# Patient Record
Sex: Male | Born: 2000 | Race: White | Hispanic: No | Marital: Single | Smoking: Never smoker
Health system: Southern US, Community
[De-identification: ages and names within clinical notes are randomized; demographics above are authoritative.]

## PROBLEM LIST (undated history)

## (undated) DIAGNOSIS — F32A Depression, unspecified: Secondary | ICD-10-CM

## (undated) DIAGNOSIS — F329 Major depressive disorder, single episode, unspecified: Secondary | ICD-10-CM

---
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
## 2019-02-10 ENCOUNTER — Other Ambulatory Visit: Payer: Self-pay

## 2019-02-10 DIAGNOSIS — Z20822 Contact with and (suspected) exposure to covid-19: Secondary | ICD-10-CM

## 2019-02-12 LAB — NOVEL CORONAVIRUS, NAA: SARS-CoV-2, NAA: NOT DETECTED

## 2019-04-24 ENCOUNTER — Ambulatory Visit: Payer: Medicaid Other | Attending: Internal Medicine

## 2019-04-24 DIAGNOSIS — Z20822 Contact with and (suspected) exposure to covid-19: Secondary | ICD-10-CM

## 2019-04-26 LAB — NOVEL CORONAVIRUS, NAA: SARS-CoV-2, NAA: NOT DETECTED

## 2020-12-03 ENCOUNTER — Emergency Department (HOSPITAL_COMMUNITY)
Admission: EM | Admit: 2020-12-03 | Discharge: 2020-12-03 | Disposition: A | Discharge status: Home / Self Care | Payer: Medicaid Other | Attending: Emergency Medicine | Admitting: Emergency Medicine

## 2020-12-03 ENCOUNTER — Encounter (HOSPITAL_COMMUNITY): Payer: Self-pay

## 2020-12-03 ENCOUNTER — Other Ambulatory Visit: Payer: Self-pay

## 2020-12-03 DIAGNOSIS — K0889 Other specified disorders of teeth and supporting structures: Secondary | ICD-10-CM | POA: Insufficient documentation

## 2020-12-03 MED ORDER — NAPROXEN 500 MG PO TABS: 500.0000 mg | ORAL_TABLET | Freq: Two times a day (BID) | ORAL | 0 refills | Status: AC

## 2020-12-03 MED ORDER — AMOXICILLIN-POT CLAVULANATE 875-125 MG PO TABS: 1.0000 | ORAL_TABLET | Freq: Two times a day (BID) | ORAL | 0 refills | Status: DC

## 2020-12-03 NOTE — ED Triage Notes (Signed)
Pt reports chipped lower right molar with severe pain.

## 2020-12-03 NOTE — Discharge Instructions (Signed)
Take antibiotics as prescribed. Take naproxen as needed for pain. Follow-up with the dentist listed below and use the dental resource guide for further resources. Return to the ER for worsening pain, trouble breathing, trouble swallowing, neck stiffness.

## 2020-12-03 NOTE — ED Provider Notes (Signed)
[facility] EMERGENCY DEPARTMENT Provider Note   CSN: [id number] Arrival date & time: 12/03/20  1036     History Chief Complaint  Patient presents with   Dental Pain    Dominic Chase is a 20 y.o. male presenting to the ED with a chief complaint of dental pain.  Reports right lower dental pain from a chipped tooth.  She has been tooth for "a while now" but states that pain has gradually worsened.  Minimal improvement noted with ibuprofen.  Has not seen a dentist in several years but is trying to get in touch with 1 unfortunately unable to do so due to lack of insurance.  He denies any drainage from the area, facial swelling, injury or trauma, neck stiffness, trismus or drooling.   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 No pertinent surgical history.     Family History  Problem Relation Age of Onset   Heart disease Father        HOCM    Social History   Tobacco Use   Smoking status: Never   Smokeless tobacco: Never  Substance Use Topics   Alcohol use: No   Drug use: No    Home Medications Prior to Admission medications   Medication Sig Start Date End Date Taking? Authorizing Provider  amoxicillin-clavulanate (AUGMENTIN) 875-125 MG tablet Take 1 tablet by mouth every 12 (twelve) hours. 12/03/20  Yes Hadriel Northup, PA-C  naproxen (NAPROSYN) 500 MG tablet Take 1 tablet (500 mg total) by mouth 2 (two) times daily. 12/03/20  Yes Lakeita Panther, PA-C    Allergies    Peanuts [peanut oil]  Review of Systems   Review of Systems  Constitutional:  Negative for chills and fever.  HENT:  Positive for dental problem. Negative for voice  change.   Respiratory:  Negative for shortness of breath.    Physical Exam Updated Vital Signs BP (!) 145/86 (BP Location: Right Arm)   Pulse 74   Temp 98.7 F (37.1 C) (Oral)   Resp 16   SpO2 100%   Physical Exam Vitals and nursing note reviewed.  Constitutional:      General: He is not in acute distress.    Appearance: He is well-developed. He is not diaphoretic.  HENT:     Head: Normocephalic and atraumatic.     Mouth/Throat:     Dentition: Abnormal dentition. Dental tenderness present. No dental caries or dental abscesses.      Comments: Chipped tooth in the indicated area.  No gross dental abscess or site of drainage. No facial, neck or cheek swelling noted. No pooling of secretions or trismus.  Normal voice noted with no difficulty swallowing or breathing.  No submandibular erythema, edema or crepitus noted.   Eyes:     General: No scleral icterus.    Conjunctiva/sclera: Conjunctivae normal.  Pulmonary:     Effort: Pulmonary effort is normal. No respiratory distress.  Musculoskeletal:     Cervical back: Normal range of motion.  Skin:    Findings: No rash.  Neurological:     Mental Status: He is alert.    ED Results / Procedures /  Treatments   Labs (all labs ordered are listed, but only abnormal results are displayed) Labs Reviewed - No data to display  EKG None  Radiology No results found.  Procedures Procedures   Medications Ordered in ED Medications - No data to display  ED Course  I have reviewed the triage vital signs and the nursing notes.  Pertinent labs & imaging results that were available during my care of the patient were reviewed by me and considered in my medical decision making (see chart for details).    MDM Rules/Calculators/A&P                           Patient with dentalgia. On exam, there is no evidence of a drainable abscess. No trismus, glossal elevation, unilateral tonsillar swelling. No evidence of retropharyngeal or  peritonsillar abscess or Ludwig angina. Will treat with naproxen. Pt instructed to follow-up with dentist as soon as possible. Resource guide provided with AVS.   Patient is hemodynamically stable, in NAD, and able to ambulate in the ED. Evaluation does not show pathology that would require ongoing emergent intervention or inpatient treatment. I explained the diagnosis to the patient. Pain has been managed and has no complaints prior to discharge. Patient is comfortable with above plan and is stable for discharge at this time. All questions were answered prior to disposition. Strict return precautions for returning to the ED were discussed. Encouraged follow up with PCP.   An After Visit Summary was printed and given to the patient.   Portions of this note were generated with Scientist, clinical (histocompatibility and immunogenetics). Dictation errors may occur despite best attempts at proofreading.  Final Clinical Impression(s) / ED Diagnoses Final diagnoses:  Pain, dental    Rx / DC Orders ED Discharge Orders          Ordered    amoxicillin-clavulanate (AUGMENTIN) 875-125 MG tablet  Every 12 hours        12/03/20 1130    naproxen (NAPROSYN) 500 MG tablet  2 times daily        12/03/20 1130             Dietrich Pates, PA-C 12/03/20 1133    Wynetta Fines, MD 12/04/20 1750

## 2023-01-24 ENCOUNTER — Emergency Department (HOSPITAL_COMMUNITY)
Admission: EM | Admit: 2023-01-24 | Discharge: 2023-01-25 | Disposition: A | Discharge status: Home / Self Care | Payer: Self-pay | Attending: Emergency Medicine | Admitting: Emergency Medicine

## 2023-01-24 DIAGNOSIS — E8809 Other disorders of plasma-protein metabolism, not elsewhere classified: Secondary | ICD-10-CM | POA: Insufficient documentation

## 2023-01-24 DIAGNOSIS — R7309 Other abnormal glucose: Secondary | ICD-10-CM | POA: Insufficient documentation

## 2023-01-24 DIAGNOSIS — R45851 Suicidal ideations: Secondary | ICD-10-CM | POA: Insufficient documentation

## 2023-01-24 DIAGNOSIS — F410 Panic disorder [episodic paroxysmal anxiety] without agoraphobia: Secondary | ICD-10-CM | POA: Insufficient documentation

## 2023-01-24 DIAGNOSIS — D72829 Elevated white blood cell count, unspecified: Secondary | ICD-10-CM | POA: Insufficient documentation

## 2023-01-24 DIAGNOSIS — T1491XA Suicide attempt, initial encounter: Secondary | ICD-10-CM

## 2023-01-24 DIAGNOSIS — Z91010 Allergy to peanuts: Secondary | ICD-10-CM | POA: Insufficient documentation

## 2023-01-24 LAB — CBC
HCT: 46.3 % (ref 39.0–52.0)
Hemoglobin: 16.3 g/dL (ref 13.0–17.0)
MCH: 31.3 pg (ref 26.0–34.0)
MCHC: 35.2 g/dL (ref 30.0–36.0)
MCV: 88.9 fL (ref 80.0–100.0)
Platelets: 227 10*3/uL (ref 150–400)
RBC: 5.21 MIL/uL (ref 4.22–5.81)
RDW: 11.7 % (ref 11.5–15.5)
WBC: 10.6 10*3/uL — ABNORMAL HIGH (ref 4.0–10.5)
nRBC: 0 % (ref 0.0–0.2)

## 2023-01-24 LAB — COMPREHENSIVE METABOLIC PANEL
ALT: 18 U/L (ref 0–44)
AST: 20 U/L (ref 15–41)
Albumin: 5.3 g/dL — ABNORMAL HIGH (ref 3.5–5.0)
Alkaline Phosphatase: 63 U/L (ref 38–126)
Anion gap: 10 (ref 5–15)
BUN: 13 mg/dL (ref 6–20)
CO2: 27 mmol/L (ref 22–32)
Calcium: 9.9 mg/dL (ref 8.9–10.3)
Chloride: 101 mmol/L (ref 98–111)
Creatinine, Ser: 0.75 mg/dL (ref 0.61–1.24)
GFR, Estimated: >60 mL/min (ref >60)
Glucose, Bld: 112 mg/dL — ABNORMAL HIGH (ref 70–99)
Potassium: 4 mmol/L (ref 3.5–5.1)
Sodium: 138 mmol/L (ref 135–145)
Total Bilirubin: 2 mg/dL — ABNORMAL HIGH (ref <1.2)
Total Protein: 8.2 g/dL — ABNORMAL HIGH (ref 6.5–8.1)

## 2023-01-24 LAB — RAPID URINE DRUG SCREEN, HOSP PERFORMED
Amphetamines: NOT DETECTED
Barbiturates: NOT DETECTED
Benzodiazepines: NOT DETECTED
Cocaine: NOT DETECTED
Opiates: NOT DETECTED
Tetrahydrocannabinol: POSITIVE — AB

## 2023-01-24 LAB — ACETAMINOPHEN LEVEL: Acetaminophen (Tylenol), Serum: <10 ug/mL — ABNORMAL LOW (ref 10–30)

## 2023-01-24 LAB — SALICYLATE LEVEL: Salicylate Lvl: <7 mg/dL — ABNORMAL LOW (ref 7.0–30.0)

## 2023-01-24 LAB — ETHANOL: Alcohol, Ethyl (B): <10 mg/dL (ref <10)

## 2023-01-24 MED ORDER — LORAZEPAM 0.5 MG PO TABS
0.5000 mg | ORAL_TABLET | Freq: Once | ORAL | Status: AC
  Administered 2023-01-24: 0.5 mg via ORAL
  Filled 2023-01-24: qty 1

## 2023-01-24 NOTE — ED Notes (Signed)
Pt is denying SI and asking about leaving at this time.

## 2023-01-24 NOTE — BH Assessment (Signed)
TTS consult has been deferred to [facility]. [facility] Coordinator will communicate in established secure chat with time of assessment and name of provider.
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
## 2023-01-24 NOTE — ED Notes (Signed)
Pt given ginger ale as well as [place] ice push pop.   Significant other to the stretcher. Pt is calm and cooperative
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
## 2023-01-25 MED ORDER — ESCITALOPRAM OXALATE 10 MG PO TABS: 10.0000 mg | ORAL_TABLET | Freq: Every day | ORAL | 2 refills | Status: DC

## 2023-01-25 MED ORDER — ESCITALOPRAM OXALATE 10 MG PO TABS: 10.0000 mg | ORAL_TABLET | Freq: Every day | ORAL | 2 refills | Status: AC

## 2023-01-25 NOTE — Progress Notes (Signed)
[facility] Consult Note  Patient Name: Dominic Chase MRN: [id number] DOB: 2001/03/13 DATE OF Consult: 01/25/2023  PRIMARY PSYCHIATRIC DIAGNOSES  1.  Panic attack  2.  Adjustment disorder with anxious and depressed mood    RECOMMENDATIONS  Recommendations: Medication recommendations: Start Escitalopram 5 mg for 4 days then increase to 10mg  daiy  Non-Medication/therapeutic recommendations: Please provide patient with outpatient resources  Is inpatient psychiatric hospitalization recommended for this patient? No (Explain why): patient denies SI or HI, no indication  Follow-Up Telepsychiatry C/L services: We will sign off for now. Please re-consult our service if needed for any concerning changes in the patient's condition, discharge planning, or questions.  Thank you for involving [place] in the care of this patient. If you have any additional questions or concerns, please call [phone] and ask for me or the provider on-call.  TELEPSYCHIATRY ATTESTATION & CONSENT  As the provider for this telehealth consult, I attest that I verified the patient's identity using two separate identifiers, introduced myself to the patient, provided my credentials, disclosed my location, and performed this encounter via a HIPAA-compliant, real-time, face-to-face, two-way, interactive audio and video platform and with the full consent and agreement of the patient (or guardian as applicable.)  Patient physical location: [facility] Emergency Department at [facility] . Telehealth provider physical location: home office in state of [place].  Video start time: 3:20 am (Central Time) Video end time: 3:45 am (Central Time)  IDENTIFYING DATA  JAGJIT RINER is a 22 y.o. year-old male for whom a psychiatric consultation has been ordered by the primary provider. The patient was identified using two separate identifiers.  CHIEF COMPLAINT/REASON FOR CONSULT  Panic attack   HISTORY OF PRESENT ILLNESS (HPI)   The patient He is a 22 year old male, who presents to the emergency department, complaining of chest pain due to a panic attack.  He tells me that he has been in a relationship of 4 years and they have breaking up.  She is sleeping this morning.  He is brokenhearted, upset, and was panicking.  She reports feeling that her needs are no longer being met in the relationship so she is taking off.  They currently live in his mom's property.  He feels that his mom is supportive, he also thinks that he has good and supportive friends that he can rely on.  He adamantly denies suicidal or homicidal ideation.  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  He was previously on Lexapro but had to stop taking it because of insurance.  He would like to restart it.  He does not have a physician to follow up with and would appreciate a referral for that.  He feels safe discharging home.Marland Kitchen  PAST PSYCHIATRIC HISTORY   Otherwise as per HPI above.  PAST MEDICAL HISTORY  Past Medical History:  Diagnosis Date   Depression      HOME MEDICATIONS  Facility Ordered Medications  Medication   [COMPLETED] LORazepam (ATIVAN) tablet 0.5 mg   PTA Medications  Medication Sig   escitalopram (LEXAPRO) 10 MG tablet Take 1 tablet (10 mg total) by mouth daily. Take 5 mg for 4 days then increase to 10 mg daily   amoxicillin-clavulanate (AUGMENTIN) 875-125 MG tablet Take 1 tablet by mouth every 12 (twelve) hours.   naproxen (NAPROSYN) 500 MG tablet Take 1 tablet (500 mg total) by mouth 2 (two) times daily.     ALLERGIES  Allergies  Allergen Reactions   Peanuts [Peanut Oil]  Anaphylaxis    Pt has an epi pen    SOCIAL & SUBSTANCE USE HISTORY  Social History   Socioeconomic History   Marital status: Single    Spouse name: Not on file   Number of children: Not on file   Years of education: Not on file   Highest education level: Not on file  Occupational History   Not on file  Tobacco Use   Smoking status: Never    Smokeless tobacco: Never  Substance and Sexual Activity   Alcohol use: No   Drug use: No   Sexual activity: Yes  Other Topics Concern   Not on file  Social History Narrative   Not on file   Social Determinants of Health   Financial Resource Strain: Not on file  Food Insecurity: Not on file  Transportation Needs: Not on file  Physical Activity: Not on file  Stress: Not on file  Social Connections: Not on file   Social History   Tobacco Use  Smoking Status Never  Smokeless Tobacco Never   Social History   Substance and Sexual Activity  Alcohol Use No   Social History   Substance and Sexual Activity  Drug Use No      FAMILY HISTORY  Family History  Problem Relation Age of Onset   Heart disease Father        HOCM    MENTAL STATUS EXAM (MSE)  Presentation  General Appearance:  Appropriate for Environment  Eye Contact: Good  Speech: Clear and Coherent  Speech Volume: Normal  Handedness:No data recorded  Mood and Affect  Mood: Depressed  Affect: Appropriate   Thought Process  Thought Processes: Coherent  Descriptions of Associations: Intact  Orientation: Full (Time, Place and Person)  Thought Content: Logical  History of Schizophrenia/Schizoaffective disorder:No data recorded Duration of Psychotic Symptoms:No data recorded Hallucinations: Hallucinations: None  Ideas of Reference: None  Suicidal Thoughts: Suicidal Thoughts: No  Homicidal Thoughts: Homicidal Thoughts: No   Sensorium  Memory: Immediate Good; Recent Good  Judgment: Fair  Insight: Fair   Art therapist  Concentration: Fair  Attention Span: Good  Recall: Good  Fund of Knowledge: Good  Language: Good   Psychomotor Activity  Psychomotor Activity: Psychomotor Activity: Normal   Assets  Assets: Communication Skills; Desire for Improvement   Sleep  Sleep: Sleep: Fair   VITALS  Blood pressure (!) 129/91, pulse 95, temperature  98.3 F (36.8 C), temperature source Oral, resp. rate 16, height 6\' 2"  (1.88 m), weight 59 kg, SpO2 99%.  LABS  Admission on 01/24/2023  Component Date Value Ref Range Status   Sodium 01/24/2023 138  135 - 145 mmol/L Final   Potassium 01/24/2023 4.0  3.5 - 5.1 mmol/L Final   Chloride 01/24/2023 101  98 - 111 mmol/L Final   CO2 01/24/2023 27  22 - 32 mmol/L Final   Glucose, Bld 01/24/2023 112 (H)  70 - 99 mg/dL Final   Glucose reference range applies only to samples taken after fasting for at least 8 hours.   BUN 01/24/2023 13  6 - 20 mg/dL Final   Creatinine, Ser 01/24/2023 0.75  0.61 - 1.24 mg/dL Final   Calcium 16/12/9602 9.9  8.9 - 10.3 mg/dL Final   Total Protein 54/11/8117 8.2 (H)  6.5 - 8.1 g/dL Final   Albumin 14/78/2956 5.3 (H)  3.5 - 5.0 g/dL Final   AST 21/30/8657 20  15 - 41 U/L Final   ALT 01/24/2023 18  0 - 44 U/L  Final   Alkaline Phosphatase 01/24/2023 63  38 - 126 U/L Final   Total Bilirubin 01/24/2023 2.0 (H)  <1.2 mg/dL Final   GFR, Estimated 01/24/2023 >60  >60 mL/min Final   Comment: (NOTE) Calculated using the CKD-EPI Creatinine Equation (2021)    Anion gap 01/24/2023 10  5 - 15 Final   Performed at [facility], [address], [place], [place] [address]   Alcohol, Ethyl (B) 01/24/2023 <10  <10 mg/dL Final   Comment: (NOTE) Lowest detectable limit for serum alcohol is 10 mg/dL.  For medical purposes only. Performed at [facility], [address], [place], [place] [address]    Salicylate Lvl 01/24/2023 <7.0 (L)  7.0 - 30.0 mg/dL Final   Performed at [facility], [address], [place], [place] [address]   Acetaminophen (Tylenol), Serum 01/24/2023 <10 (L)  10 - 30 ug/mL Final   Comment: (NOTE) Therapeutic concentrations vary significantly. A range of 10-30 ug/mL  may be an effective concentration for many patients. However, some  are best treated at concentrations outside of this  range. Acetaminophen concentrations >150 ug/mL at 4 hours after ingestion  and >50 ug/mL at 12 hours after ingestion are often associated with  toxic reactions.  Performed at [facility], [address] [place], [place] [address]    WBC 01/24/2023 10.6 (H)  4.0 - 10.5 K/uL Final   RBC 01/24/2023 5.21  4.22 - 5.81 MIL/uL Final   Hemoglobin 01/24/2023 16.3  13.0 - 17.0 g/dL Final   HCT 03/47/4259 46.3  39.0 - 52.0 % Final   MCV 01/24/2023 88.9  80.0 - 100.0 fL Final   MCH 01/24/2023 31.3  26.0 - 34.0 pg Final   MCHC 01/24/2023 35.2  30.0 - 36.0 g/dL Final   RDW 56/38/7564 11.7  11.5 - 15.5 % Final   Platelets 01/24/2023 227  150 - 400 K/uL Final   nRBC 01/24/2023 0.0  0.0 - 0.2 % Final   Performed at [facility], [address] [place], [place] [address]   Opiates 01/24/2023 NONE DETECTED  NONE DETECTED Final   Cocaine 01/24/2023 NONE DETECTED  NONE DETECTED Final   Benzodiazepines 01/24/2023 NONE DETECTED  NONE DETECTED Final   Amphetamines 01/24/2023 NONE DETECTED  NONE DETECTED Final   Tetrahydrocannabinol 01/24/2023 POSITIVE (A)  NONE DETECTED Final   Barbiturates 01/24/2023 NONE DETECTED  NONE DETECTED Final   Comment: (NOTE) DRUG SCREEN FOR MEDICAL PURPOSES ONLY.  IF CONFIRMATION IS NEEDED FOR ANY PURPOSE, NOTIFY LAB WITHIN 5 DAYS.  LOWEST DETECTABLE LIMITS FOR URINE DRUG SCREEN Drug Class                     Cutoff (ng/mL) Amphetamine and metabolites    1000 Barbiturate and metabolites    200 Benzodiazepine                 200 Opiates and metabolites        300 Cocaine and metabolites        300 THC                            50 Performed at [facility], [address], [place], [place] [address]     PSYCHIATRIC REVIEW OF SYSTEMS (ROS)  ROS: Notable for the following relevant positive findings: ROS  Additional findings:      Musculoskeletal: No abnormal movements  observed      Gait & Station: Normal       Pain Screening: Denies      RISK FORMULATION/ASSESSMENT  Is the patient experiencing any suicidal or homicidal ideations: No        Protective factors considered for safety management: future oriented, help seeking   Risk factors/concerns considered for safety management:  Depression Male gender  Is there a safety management plan with the patient and treatment team to minimize risk factors and promote protective factors: Yes           Explain: medication and outpatient referral  Is crisis care placement or psychiatric hospitalization recommended: No     Based on my current evaluation and risk assessment, patient is determined at this time to be at:  Low risk  *RISK ASSESSMENT Risk assessment is a dynamic process; it is possible that this patient's condition, and risk level, may change. This should be re-evaluated and managed over time as appropriate. Please re-consult psychiatric consult services if additional assistance is needed in terms of risk assessment and management. If your team decides to discharge this patient, please advise the patient how to best access emergency psychiatric services, or to call 911, if their condition worsens or they feel unsafe in any way.   Dian Situ, MD Telepsychiatry Consult ServicesPatient ID: Ruthann Cancer, male   DOB: Nov 30, 2000, 22 y.o.   MRN: [id number]

## 2023-02-21 ENCOUNTER — Other Ambulatory Visit: Payer: Self-pay

## 2023-02-21 ENCOUNTER — Emergency Department (HOSPITAL_COMMUNITY)
Admission: EM | Admit: 2023-02-21 | Discharge: 2023-02-21 | Disposition: A | Discharge status: Home / Self Care | Payer: Self-pay | Attending: Emergency Medicine | Admitting: Emergency Medicine

## 2023-02-21 DIAGNOSIS — K0889 Other specified disorders of teeth and supporting structures: Secondary | ICD-10-CM

## 2023-02-21 DIAGNOSIS — K029 Dental caries, unspecified: Secondary | ICD-10-CM | POA: Insufficient documentation

## 2023-02-21 DIAGNOSIS — Z91010 Allergy to peanuts: Secondary | ICD-10-CM | POA: Insufficient documentation

## 2023-02-21 MED ORDER — AMOXICILLIN-POT CLAVULANATE 875-125 MG PO TABS
1.0000 | ORAL_TABLET | Freq: Once | ORAL | Status: AC
  Administered 2023-02-21: 1 via ORAL
  Filled 2023-02-21: qty 1

## 2023-02-21 MED ORDER — LIDOCAINE VISCOUS HCL 2 % MT SOLN
15.0000 mL | Freq: Once | OROMUCOSAL | Status: AC
  Administered 2023-02-21: 15 mL via OROMUCOSAL
  Filled 2023-02-21: qty 15

## 2023-02-21 MED ORDER — IBUPROFEN 800 MG PO TABS
800.0000 mg | ORAL_TABLET | Freq: Once | ORAL | Status: AC
  Administered 2023-02-21: 800 mg via ORAL
  Filled 2023-02-21: qty 1

## 2023-02-21 MED ORDER — IBUPROFEN 600 MG PO TABS: 600.0000 mg | ORAL_TABLET | Freq: Four times a day (QID) | ORAL | 0 refills | Status: AC | PRN

## 2023-02-21 MED ORDER — OXYCODONE-ACETAMINOPHEN 5-325 MG PO TABS
1.0000 | ORAL_TABLET | Freq: Once | ORAL | Status: AC
  Administered 2023-02-21: 1 via ORAL
  Filled 2023-02-21: qty 1

## 2023-02-21 MED ORDER — AMOXICILLIN-POT CLAVULANATE 875-125 MG PO TABS
1.0000 | ORAL_TABLET | Freq: Two times a day (BID) | ORAL | 0 refills | Status: AC
Start: 2023-02-21 — End: ?

## 2023-02-21 NOTE — ED Triage Notes (Signed)
Pt. Stated, Dominic Chase had a cracked back lower right tooth  for a year and the last 4 days its become infected and so painful Im unable to sleep

## 2023-02-21 NOTE — ED Provider Notes (Addendum)
[facility] Provider Note   CSN: [id number] Arrival date & time: 02/21/23  1023     History Chief Complaint  Patient presents with   Dental Pain   Oral Swelling    Dominic Chase is a 22 y.o. male reportedly otherwise healthy presents to the ER for evaluation of dental pain. The patient reports that he has been having problems with this broken tooth off and on for over 2 years. He has not followed up with a dentist. He reports that for the past 3-4 days it has been worsening. He denies and fevers or facial swelling.  No known drug allergies.   Dental Pain Associated symptoms: no congestion, no facial swelling, no fever and no neck pain        Home Medications Prior to Admission medications   Medication Sig Start Date End Date Taking? Authorizing Provider  amoxicillin-clavulanate (AUGMENTIN) 875-125 MG tablet Take 1 tablet by mouth every 12 (twelve) hours. 12/03/20   Khatri, Hina, PA-C  escitalopram (LEXAPRO) 10 MG tablet Take 1 tablet (10 mg total) by mouth daily. Take 5 mg for 4 days then increase to 10 mg daily 01/25/23 01/25/24  Prosperi, Christian H, PA-C  naproxen (NAPROSYN) 500 MG tablet Take 1 tablet (500 mg total) by mouth 2 (two) times daily. 12/03/20   Khatri, Hina, PA-C      Allergies    Peanuts [peanut oil]    Review of Systems   Review of Systems  Constitutional:  Negative for chills and fever.  HENT:  Positive for dental problem. Negative for congestion, facial swelling, rhinorrhea and trouble swallowing.   Musculoskeletal:  Negative for neck pain and neck stiffness.    Physical Exam Updated Vital Signs BP 135/82   Pulse 88   Temp 99.2 F (37.3 C) (Oral)   Resp 18   SpO2 100%  Physical Exam Vitals and nursing note reviewed.  Constitutional:      General: He is not in acute distress.    Appearance: He is not toxic-appearing.  HENT:     Mouth/Throat:     Mouth: Mucous membranes are moist.     Comments:  Dental caries present.  Broken back right lower molar.  There is no surrounding induration or fluctuance.  Tenderness to palpation.  I do not appreciate any abscess.  Moist mucous membranes.  Uvula midline.  Airway patent.  Controlling secretions.  Speaking in full sentences with ease.  No sublingual elevation.  No trismus. Eyes:     General: No scleral icterus. Neck:     Comments: No swelling.  No overlying skin changes. Cardiovascular:     Rate and Rhythm: Normal rate.  Pulmonary:     Effort: Pulmonary effort is normal. No respiratory distress.  Lymphadenopathy:     Cervical: No cervical adenopathy.  Neurological:     Mental Status: He is alert.     ED Results / Procedures / Treatments   Labs (all labs ordered are listed, but only abnormal results are displayed) Labs Reviewed - No data to display  EKG None  Radiology No results found.  Procedures Procedures   Medications Ordered in ED Medications  amoxicillin-clavulanate (AUGMENTIN) 875-125 MG per tablet 1 tablet (has no administration in time range)  oxyCODONE-acetaminophen (PERCOCET/ROXICET) 5-325 MG per tablet 1 tablet (has no administration in time range)  ibuprofen (ADVIL) tablet 800 mg (800 mg Oral Given 02/21/23 1511)  lidocaine (XYLOCAINE) 2 % viscous mouth solution 15 mL (15 mLs  Mouth/Throat Given 02/21/23 1511)    ED Course/ Medical Decision Making/ A&P    Medical Decision Making Risk Prescription drug management.   22 y.o. male presents to the ER today for evaluation of back right lower dental pain. Differential diagnosis includes but is not limited to dental pain, caries, dental abscess, Ludwig's, ulcerative gingivitis. Vital signs show unremarkable. Physical exam as noted above.   He is asking for food. Nursing aware that he can eat.   There is a broken back right lower molar. No surrounding erythema, induration, or fluctuance. No trismus. Controlling secretions. No sublingual elevation.  Speaking in  full sentences.  I do not think this is any disease infection given the lack of facial swelling.  Again there is no induration or fluctuance.  Doubt any Ludwick's.  The patient was given ibuprofen here.  Will give him a dose of Percocet while he is in the hospital.  Reports that his mom will be picking him up.  Have also given him his first dose of his antibiotic.  Augmentin given.  Will send him home with some Augmentin and ibuprofen.  I recommended that he take ibuprofen 600 mg and Tylenol 1000 mg every 6 hours as needed for pain.  Dental resources also given.  Discussed with patient that ultimately he will need to follow-up with a dentist to get this fixed.  He stable for discharge home with outpatient follow-up.  We discussed plan at bedside. We discussed strict return precautions and red flag symptoms. The patient verbalized their understanding and agrees to the plan. The patient is stable and being discharged home in good condition.  Portions of this report may have been transcribed using voice recognition software. Every effort was made to ensure accuracy; however, inadvertent computerized transcription errors may be present.   Final Clinical Impression(s) / ED Diagnoses Final diagnoses:  Pain, dental    Rx / DC Orders ED Discharge Orders          Ordered    ibuprofen (ADVIL) 600 MG tablet  Every 6 hours PRN        02/21/23 1819    amoxicillin-clavulanate (AUGMENTIN) 875-125 MG tablet  Every 12 hours        02/21/23 1819              Achille Rich, PA-C 02/21/23 1822    Achille Rich, PA-C 02/21/23 1822    Lonell Grandchild, MD 02/21/23 2109

## 2023-02-21 NOTE — Discharge Instructions (Addendum)
You were seen in the emergency department today for evaluation of dental pain.  This is likely from the broken tooth that you have had for a few years.  You will ultimately need to see a dentist for this.  I have given you a list of dental resources in your area.  Please call to schedule an appointment.  You will need an antibiotic for this.  I am prescribing you Augmentin.  Take twice a day for the next week.  You have been given your first dose tonight.  For pain, recommend taking 600 mg of ibuprofen and 1000 mg of Tylenol every 6 hours as needed for pain.  If you have any facial swelling, trouble swallowing, fever, inability to open your mouth or control your saliva, return to the ER.  If you have any other concerns, new or worsening symptoms, please return to your nearest emergency department for evaluation.  Contact a dental care provider if: You have any unexplained dental pain. Your pain is not controlled with medicines. Your symptoms get worse. You have new symptoms. Get help right away if: You are unable to open your mouth. You are having trouble breathing or swallowing. You have a fever. You notice that your face, neck, or jaw is swollen. These symptoms may represent a serious problem that is an emergency. Do not wait to see if the symptoms will go away. Get medical help right away. Call your local emergency services (911 in the [place] Do not drive yourself to the hospital.

## 2023-02-21 NOTE — ED Notes (Signed)
Pt complaining of worsening pain. MSE provider notified and orders placed. See MAR.
